# Patient Record
Sex: Male | Born: 1990 | Race: Black or African American | Hispanic: No | Smoking: Former smoker
Health system: Southern US, Community
[De-identification: ages and names within clinical notes are randomized; demographics above are authoritative.]

---

## 2016-05-07 ENCOUNTER — Encounter (HOSPITAL_COMMUNITY): Payer: Self-pay | Admitting: Emergency Medicine

## 2016-05-07 ENCOUNTER — Emergency Department (HOSPITAL_COMMUNITY)

## 2016-05-07 ENCOUNTER — Emergency Department (HOSPITAL_COMMUNITY)
Admission: EM | Admit: 2016-05-07 | Discharge: 2016-05-07 | Disposition: A | Discharge status: Home / Self Care | Attending: Emergency Medicine | Admitting: Emergency Medicine

## 2016-05-07 DIAGNOSIS — M25511 Pain in right shoulder: Secondary | ICD-10-CM | POA: Insufficient documentation

## 2016-05-07 DIAGNOSIS — Z87891 Personal history of nicotine dependence: Secondary | ICD-10-CM | POA: Insufficient documentation

## 2016-05-07 MED ORDER — IBUPROFEN 800 MG PO TABS: 800.0000 mg | ORAL_TABLET | Freq: Three times a day (TID) | ORAL | Status: AC

## 2016-05-07 NOTE — ED Provider Notes (Signed)
CSN: [id number]     Arrival date & time 05/07/16  1455 History   First MD Initiated Contact with Patient 05/07/16 1603     Chief Complaint  Patient presents with  . Shoulder Pain     (Consider location/radiation/quality/duration/timing/severity/associated sxs/prior Treatment) HPI   Shaun Hill is a 25 y.o. male  Who is an inmate at a local correctional facility who presents to the Emergency Department complaining of sudden onset of right shoulder pain.  Pain began while mopping the floor.  Reports hearing a "pop" to his shoulder and now has a "grinding" sensation with movement.  Pain worse with movement.  He denies fever, swelling, numbness or weakness of the arm, headache, dizziness neck and chest pain. He has not tried any medications for symptom relief.    History reviewed. No pertinent past medical history. History reviewed. No pertinent past surgical history. History reviewed. No pertinent family history. Social History  Substance Use Topics  . Smoking status: Former Games developer  . Smokeless tobacco: None  . Alcohol Use: No    Review of Systems  Constitutional: Negative for fever and chills.  Gastrointestinal: Negative for nausea and vomiting.  Musculoskeletal: Positive for arthralgias (right shoulder pain). Negative for joint swelling and neck pain.  Skin: Negative for color change, rash and wound.  Neurological: Negative for dizziness, weakness, numbness and headaches.  All other systems reviewed and are negative.     Allergies  Review of patient's allergies indicates no known allergies.  Home Medications   Prior to Admission medications   Not on File   BP 149/84 mmHg  Pulse 65  Temp(Src) 98.3 F (36.8 C) (Oral)  Resp 18  Ht  (1.854 m)  Wt 92.987 kg  BMI 27.05 kg/m2  SpO2 100% Physical Exam  Constitutional: He is oriented to person, place, and time. He appears well-developed and well-nourished. No distress.  HENT:  Head: Normocephalic and  atraumatic.  Neck: Normal range of motion. Neck supple. No thyromegaly present.  Cardiovascular: Normal rate, regular rhythm and intact distal pulses.   No murmur heard. Pulmonary/Chest: Effort normal and breath sounds normal. No respiratory distress. He exhibits no tenderness.  Musculoskeletal: He exhibits tenderness. He exhibits no edema.  ttp of the anterior right shoulder.  Pain with abduction of the right arm and rotation of the shoulder.  Radial pulse is brisk, distal sensation intact, CR< 2 sec. Grip strength is strong and symmetrical.   No abrasions, edema , erythema or step-off deformity of the joint.   Lymphadenopathy:    He has no cervical adenopathy.  Neurological: He is alert and oriented to person, place, and time. He has normal strength. No sensory deficit. He exhibits normal muscle tone. Coordination normal.  Reflex Scores:      Tricep reflexes are 1+ on the right side and 2+ on the left side.      Bicep reflexes are 1+ on the right side and 2+ on the left side. Skin: Skin is warm and dry.  Nursing note and vitals reviewed.   ED Course  Procedures (including critical care time) Labs Review Labs Reviewed - No data to display  Imaging Review Dg Shoulder Right  05/07/2016  CLINICAL DATA:  The patient felt the shoulder pop yesterday while at work with right shoulder pain. EXAM: RIGHT SHOULDER - 2+ VIEW COMPARISON:  None. FINDINGS: There is no evidence of fracture or dislocation. There is no evidence of arthropathy or other focal bone abnormality. Soft tissues are unremarkable. IMPRESSION: Negative. Electronically  Signed   By: Sherian ReinWei-Chen  Lin M.D.   On: 05/07/2016 15:51   I have personally reviewed and evaluated these images and lab results as part of my medical decision-making.   EKG Interpretation None      MDM   Final diagnoses:  Right shoulder pain   Pt with likely sprain.  Discussed possible rotator cuff injury as well.  No concerning sx's for emergent process.  NV  intact.  No concerning sx's for septic joint.  Compartments soft.  Pt agrees to rest, ice and ibuprofen.  Referral to ortho if not improving    Pauline Ausammy Shantell Belongia, PA-C 05/08/16 2056  Bethann BerkshireJoseph Zammit, MD 05/09/16 [id number]

## 2016-05-07 NOTE — ED Notes (Signed)
PT c/o right shoulder pain after hearing a "pop" to right shoulder while mopping the floor yesterday.

## 2016-05-07 NOTE — Discharge Instructions (Signed)
Cryotherapy  Cryotherapy is when you put ice on your injury. Ice helps lessen pain and puffiness (swelling) after an injury. Ice works the best when you start using it in the first 24 to 48 hours after an injury.  HOME CARE  · Put a dry or damp towel between the ice pack and your skin.  · You may press gently on the ice pack.  · Leave the ice on for no more than 10 to 20 minutes at a time.  · Check your skin after 5 minutes to make sure your skin is okay.  · Rest at least 20 minutes between ice pack uses.  · Stop using ice when your skin loses feeling (numbness).  · Do not use ice on someone who cannot tell you when it hurts. This includes small children and people with memory problems (dementia).  GET HELP RIGHT AWAY IF:  · You have white spots on your skin.  · Your skin turns blue or pale.  · Your skin feels waxy or hard.  · Your puffiness gets worse.  MAKE SURE YOU:   · Understand these instructions.  · Will watch your condition.  · Will get help right away if you are not doing well or get worse.     This information is not intended to replace advice given to you by your health care provider. Make sure you discuss any questions you have with your health care provider.     Document Released: 05/06/2008 Document Revised: 02/10/2012 Document Reviewed: 07/11/2011  [employer]vier Interactive Patient Education ©2016 [employer]

## 2017-05-07 IMAGING — DX DG SHOULDER 2+V*R*
3 series · 3 of 3 positions shown · non-contrast
Comparison: None.

CLINICAL DATA: The patient felt the shoulder pop yesterday while at
work with right shoulder pain.

EXAM:
RIGHT SHOULDER - 2+ VIEW

[shoulder grashey]
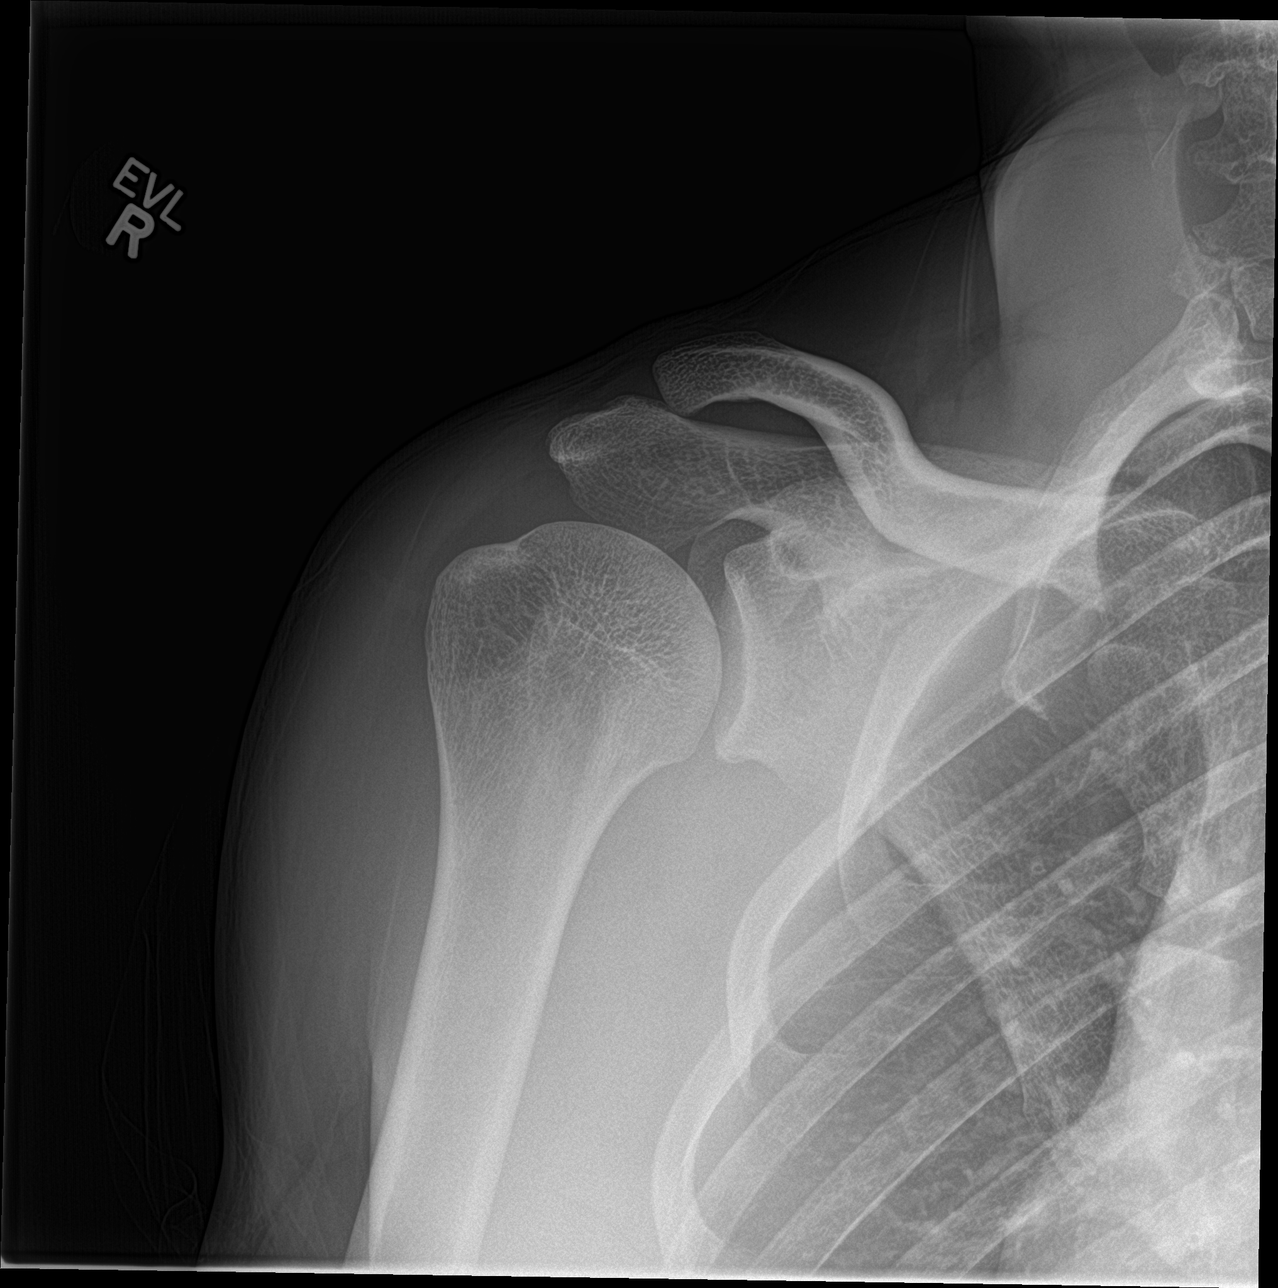

[shoulder y view]
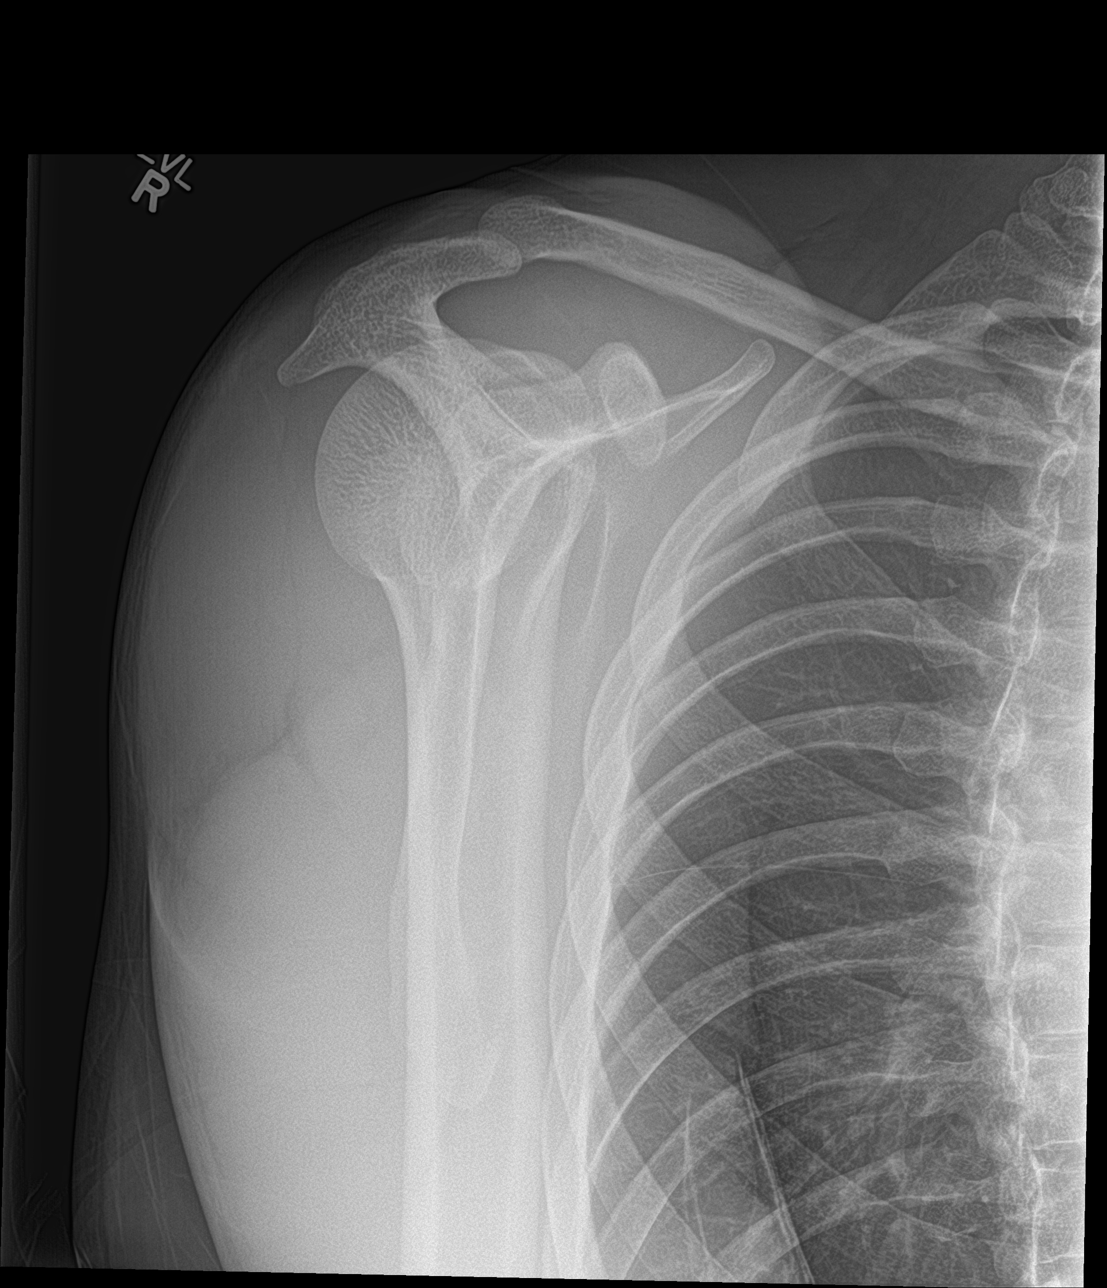

[shoulder axillary]
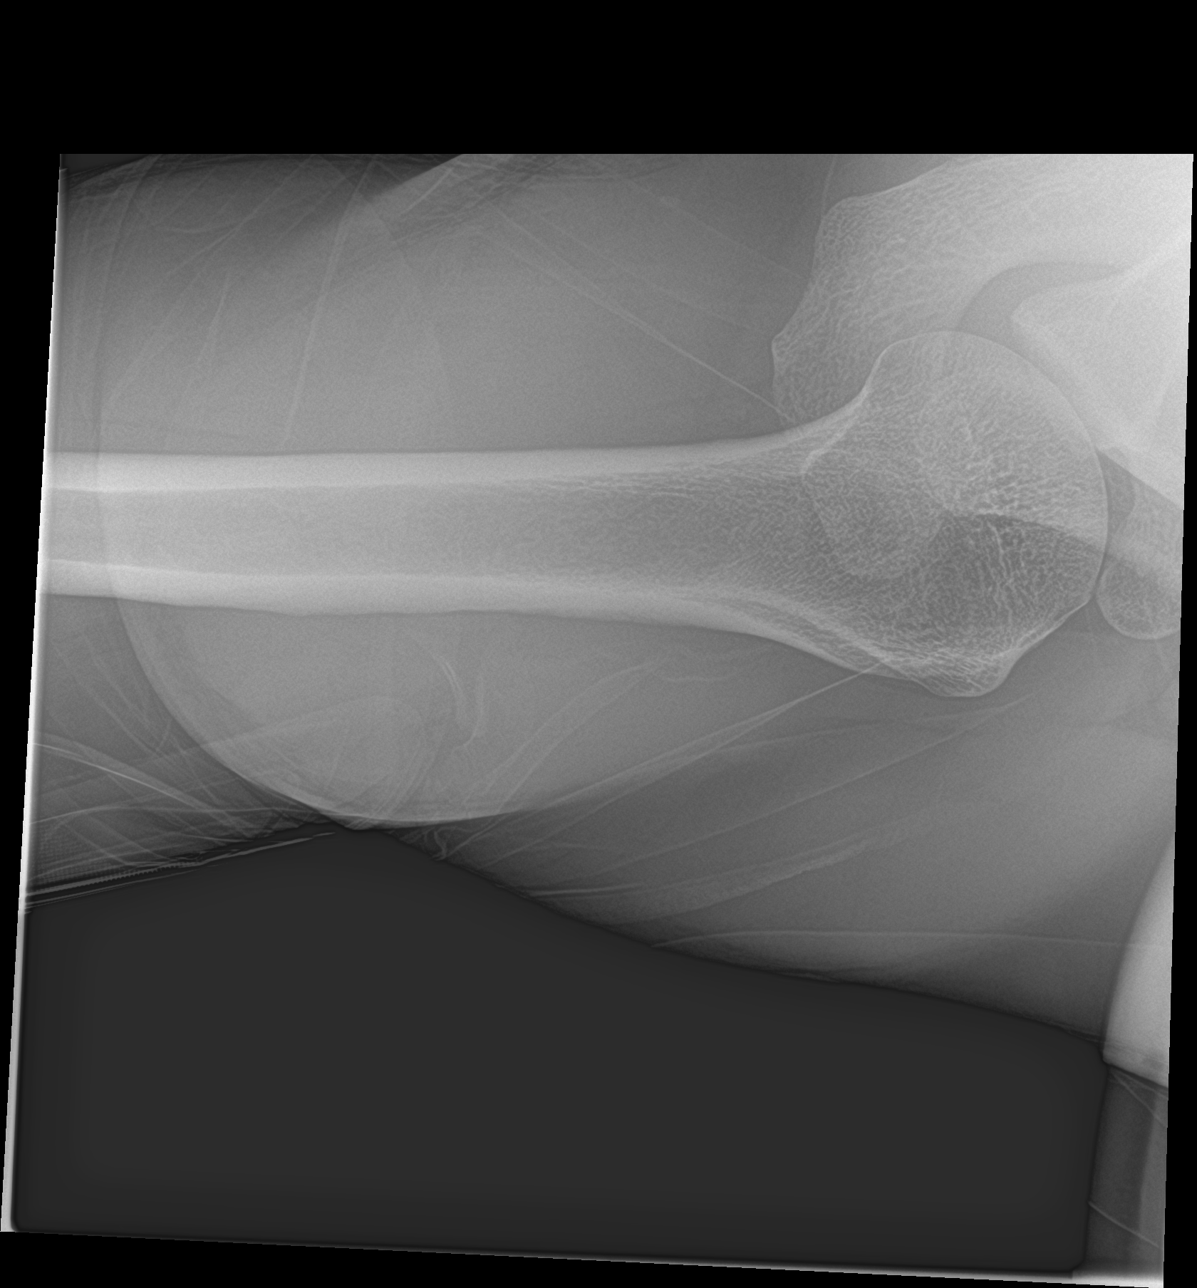

[3 of 3 positions shown; findings below may reference images not displayed]

FINDINGS: There is no evidence of fracture or dislocation. There is no
evidence of arthropathy or other focal bone abnormality. Soft
tissues are unremarkable.
IMPRESSION: Negative.
